# Patient Record
Sex: Female | Born: 1970 | Race: White | Hispanic: No | Marital: Married | Smoking: Current every day smoker
Health system: Southern US, Community
[De-identification: ages and names within clinical notes are randomized; demographics above are authoritative.]

## PROBLEM LIST (undated history)

## (undated) DIAGNOSIS — C801 Malignant (primary) neoplasm, unspecified: Secondary | ICD-10-CM

## (undated) DIAGNOSIS — F909 Attention-deficit hyperactivity disorder, unspecified type: Secondary | ICD-10-CM

## (undated) DIAGNOSIS — G8929 Other chronic pain: Secondary | ICD-10-CM

## (undated) DIAGNOSIS — I10 Essential (primary) hypertension: Secondary | ICD-10-CM

## (undated) DIAGNOSIS — J45909 Unspecified asthma, uncomplicated: Secondary | ICD-10-CM

## (undated) DIAGNOSIS — M542 Cervicalgia: Secondary | ICD-10-CM

## (undated) DIAGNOSIS — M549 Dorsalgia, unspecified: Secondary | ICD-10-CM

## (undated) DIAGNOSIS — F419 Anxiety disorder, unspecified: Secondary | ICD-10-CM

## (undated) HISTORY — PX: TONSILLECTOMY: SUR1361

## (undated) HISTORY — PX: ABDOMINAL SURGERY: SHX537

## (undated) HISTORY — PX: ABDOMINAL HYSTERECTOMY: SHX81

---

## 2002-04-26 ENCOUNTER — Encounter: Payer: Self-pay | Admitting: Emergency Medicine

## 2002-04-26 ENCOUNTER — Emergency Department (HOSPITAL_COMMUNITY): Admission: EM | Admit: 2002-04-26 | Discharge: 2002-04-26 | Payer: Self-pay | Admitting: Emergency Medicine

## 2015-10-06 ENCOUNTER — Encounter (HOSPITAL_COMMUNITY): Payer: Self-pay | Admitting: Emergency Medicine

## 2015-10-06 ENCOUNTER — Emergency Department (HOSPITAL_COMMUNITY): Payer: Self-pay

## 2015-10-06 ENCOUNTER — Emergency Department (HOSPITAL_COMMUNITY)
Admission: EM | Admit: 2015-10-06 | Discharge: 2015-10-06 | Disposition: A | Discharge status: Home / Self Care | Payer: Self-pay | Attending: Emergency Medicine | Admitting: Emergency Medicine

## 2015-10-06 DIAGNOSIS — F17210 Nicotine dependence, cigarettes, uncomplicated: Secondary | ICD-10-CM | POA: Insufficient documentation

## 2015-10-06 DIAGNOSIS — J45901 Unspecified asthma with (acute) exacerbation: Secondary | ICD-10-CM | POA: Insufficient documentation

## 2015-10-06 DIAGNOSIS — I10 Essential (primary) hypertension: Secondary | ICD-10-CM | POA: Insufficient documentation

## 2015-10-06 DIAGNOSIS — R000 Tachycardia, unspecified: Secondary | ICD-10-CM | POA: Insufficient documentation

## 2015-10-06 HISTORY — DX: Essential (primary) hypertension: I10

## 2015-10-06 HISTORY — DX: Unspecified asthma, uncomplicated: J45.909

## 2015-10-06 LAB — BASIC METABOLIC PANEL
Anion gap: 9 (ref 5–15)
BUN: 9 mg/dL (ref 6–20)
CALCIUM: 9.7 mg/dL (ref 8.9–10.3)
CO2: 25 mmol/L (ref 22–32)
Chloride: 106 mmol/L (ref 101–111)
Creatinine, Ser: 0.83 mg/dL (ref 0.44–1.00)
GFR calc Af Amer: >60 mL/min (ref >60)
GLUCOSE: 103 mg/dL — AB (ref 65–99)
POTASSIUM: 3.9 mmol/L (ref 3.5–5.1)
Sodium: 140 mmol/L (ref 135–145)

## 2015-10-06 LAB — CBC WITH DIFFERENTIAL/PLATELET
Basophils Absolute: 0 10*3/uL (ref 0.0–0.1)
Basophils Relative: 0 %
EOS PCT: 8 %
Eosinophils Absolute: 0.7 10*3/uL (ref 0.0–0.7)
HEMATOCRIT: 39 % (ref 36.0–46.0)
Hemoglobin: 13.3 g/dL (ref 12.0–15.0)
LYMPHS ABS: 2.7 10*3/uL (ref 0.7–4.0)
LYMPHS PCT: 30 %
MCH: 28.3 pg (ref 26.0–34.0)
MCHC: 34.1 g/dL (ref 30.0–36.0)
MCV: 83 fL (ref 78.0–100.0)
MONO ABS: 0.4 10*3/uL (ref 0.1–1.0)
MONOS PCT: 5 %
Neutro Abs: 5.1 10*3/uL (ref 1.7–7.7)
Neutrophils Relative %: 57 %
PLATELETS: 361 10*3/uL (ref 150–400)
RBC: 4.7 MIL/uL (ref 3.87–5.11)
RDW: 13.9 % (ref 11.5–15.5)
WBC: 8.9 10*3/uL (ref 4.0–10.5)

## 2015-10-06 MED ORDER — SODIUM CHLORIDE 0.9 % IV BOLUS (SEPSIS)
1000.0000 mL | Freq: Once | INTRAVENOUS | Status: AC
  Administered 2015-10-06: 1000 mL via INTRAVENOUS

## 2015-10-06 MED ORDER — MAGNESIUM SULFATE 2 GM/50ML IV SOLN
2.0000 g | Freq: Once | INTRAVENOUS | Status: AC
  Administered 2015-10-06: 2 g via INTRAVENOUS
  Filled 2015-10-06: qty 50

## 2015-10-06 MED ORDER — ALBUTEROL SULFATE HFA 108 (90 BASE) MCG/ACT IN AERS
2.0000 | INHALATION_SPRAY | Freq: Once | RESPIRATORY_TRACT | Status: AC
  Administered 2015-10-06: 2 via RESPIRATORY_TRACT
  Filled 2015-10-06: qty 6.7

## 2015-10-06 MED ORDER — ALBUTEROL SULFATE (2.5 MG/3ML) 0.083% IN NEBU
5.0000 mg | INHALATION_SOLUTION | Freq: Once | RESPIRATORY_TRACT | Status: AC
  Administered 2015-10-06: 5 mg via RESPIRATORY_TRACT

## 2015-10-06 MED ORDER — PREDNISONE 20 MG PO TABS: 60.0000 mg | ORAL_TABLET | Freq: Every day | ORAL | Status: AC

## 2015-10-06 MED ORDER — METHYLPREDNISOLONE SODIUM SUCC 125 MG IJ SOLR
125.0000 mg | Freq: Once | INTRAMUSCULAR | Status: AC
  Administered 2015-10-06: 125 mg via INTRAVENOUS
  Filled 2015-10-06: qty 2

## 2015-10-06 MED ORDER — ALBUTEROL SULFATE (2.5 MG/3ML) 0.083% IN NEBU
INHALATION_SOLUTION | RESPIRATORY_TRACT | Status: AC
  Filled 2015-10-06: qty 6

## 2015-10-06 MED ORDER — IPRATROPIUM BROMIDE 0.02 % IN SOLN
1.0000 mg | Freq: Once | RESPIRATORY_TRACT | Status: AC
  Administered 2015-10-06: 1 mg via RESPIRATORY_TRACT
  Filled 2015-10-06: qty 5

## 2015-10-06 MED ORDER — ACETAMINOPHEN-CODEINE #3 300-30 MG PO TABS
1.0000 | ORAL_TABLET | Freq: Once | ORAL | Status: AC
  Administered 2015-10-06: 1 via ORAL
  Filled 2015-10-06: qty 1

## 2015-10-06 MED ORDER — BENZONATATE 100 MG PO CAPS: 100.0000 mg | ORAL_CAPSULE | Freq: Two times a day (BID) | ORAL | Status: AC | PRN

## 2015-10-06 NOTE — ED Provider Notes (Signed)
CSN: [id number]     Arrival date & time 10/06/15  0211 History  By signing my name below, I, Sonum Patel, attest that this documentation has been prepared under the direction and in the presence of [name], MD. Electronically Signed: Sonum Patel, Education administrator. 10/06/2015. 3:12 AM.    Chief Complaint  Patient presents with  . Shortness of Breath  . Cough  . Wheezing   The history is provided by the patient. No language interpreter was used.     HPI Comments: Felicia Rhodes is a 44 y.o. female with past medical history of asthma who presents to the Emergency Department complaining of an intermittent cough with associated SOB and wheezing for the past week which worsened yesterday. She also has associated subjective fever and chills, sore throat, HA, and upper chest pain with deep breathing. She states the symptoms initially improved but then worsened yesterday. She reports lower leg swelling with the right leg worse than the left. She does not have an inhaler at home. She reports starting a new job where a co-worker has a cough. She is current daily smoker.    Past Medical History  Diagnosis Date  . Asthma   . Hypertension    History reviewed. No pertinent past surgical history. No family history on file. Social History  Substance Use Topics  . Smoking status: Current Every Day Smoker  . Smokeless tobacco: None  . Alcohol Use: No   OB History    No data available     Review of Systems  A complete 10 system review of systems was obtained and all systems are negative except as noted in the HPI and PMH.    Allergies  Review of patient's allergies indicates no known allergies.  Home Medications   Prior to Admission medications   Not on File   BP 161/107 mmHg  Pulse 131  Temp(Src) 97.9 F (36.6 C) (Oral)  Resp 34  Ht 4\' 11"  (1.499 m)  Wt 152 lb (68.947 kg)  BMI 30.68 kg/m2  SpO2 95%  LMP 09/17/2015 Physical Exam  Constitutional: She is oriented to person, place, and  time. She appears well-developed and well-nourished. She appears distressed.  HENT:  Head: Normocephalic and atraumatic.  Nose: Nose normal.  Mouth/Throat: Oropharynx is clear and moist. No oropharyngeal exudate.  Eyes: Conjunctivae and EOM are normal. Pupils are equal, round, and reactive to light. No scleral icterus.  Neck: Normal range of motion. Neck supple. No JVD present. No tracheal deviation present. No thyromegaly present.  Cardiovascular: Regular rhythm and normal heart sounds.  Tachycardia present.  Exam reveals no gallop and no friction rub.   No murmur heard. Pulmonary/Chest: Effort normal. Tachypnea noted. No respiratory distress. She has wheezes. She exhibits no tenderness.  Mild inspiratory wheezing. Moderate expiratory wheezing   Abdominal: Soft. Bowel sounds are normal. She exhibits no distension and no mass. There is no tenderness. There is no rebound and no guarding.  Musculoskeletal: Normal range of motion. She exhibits no edema or tenderness.  Lymphadenopathy:    She has no cervical adenopathy.  Neurological: She is alert and oriented to person, place, and time. No cranial nerve deficit. She exhibits normal muscle tone.  Skin: Skin is warm and dry. No rash noted. No erythema. No pallor.  Nursing note and vitals reviewed.   ED Course  Procedures (including critical care time)  DIAGNOSTIC STUDIES: Oxygen Saturation is 95% on RA, normal by my interpretation.    COORDINATION OF CARE: 3:17 AM Discussed  treatment plan with pt at bedside and pt agreed to plan.   Labs Review Labs Reviewed  BASIC METABOLIC PANEL - Abnormal; Notable for the following:    Glucose, Bld 103 (*)    All other components within normal limits  CBC WITH DIFFERENTIAL/PLATELET    Imaging Review Dg Chest 2 View  10/06/2015  CLINICAL DATA:  Productive cough for 1 day.  Asthma. EXAM: CHEST  2 VIEW COMPARISON:  None. FINDINGS: The cardiomediastinal contours are normal. Minimal subsegmental  atelectasis at the left lung base. Pulmonary vasculature is normal. No consolidation, pleural effusion, or pneumothorax. No acute osseous abnormalities are seen. IMPRESSION: Minimal subsegmental atelectasis at the left lung base. Otherwise clear lungs. Electronically Signed   By: Jeb Levering M.D.   On: 10/06/2015 04:03   I have personally reviewed and evaluated these images and lab results as part of my medical decision-making.   EKG Interpretation   Date/Time:  Saturday October 06 2015 02:28:15 EST Ventricular Rate:  106 PR Interval:  138 QRS Duration: 82 QT Interval:  332 QTC Calculation: 441 R Axis:   40 Text Interpretation:  Sinus tachycardia Otherwise normal ECG No old  tracing to compare Confirmed by Glynn Octave [phone]) on  10/06/2015 4:35:32 AM      MDM   Final diagnoses:  None    Patient presents to emergency department for asthma exacerbation. On my examination she has only mild wheezing. Per the RN staff, patient had significant wheezing. She had already received albuterol treatment prior to my assessment. I then ordered ipratropium, Solu-Medrol, magnesium for further care. Patient was tachycardic to the 130s, likely due to albuterol treatment. She states she is one out of her inhaler at home. She has no history of blood clots in the past, nor does she have any risk factors. I be on inhaler was refilled, patient will be sent home with prednisone course. Patient also given Tessalon Perles for cough. She appears well in no acute distress, she states she is now at her baseline. Vital signs were within her normal limits aside from tachycardia induced by albuterol, patient is safe for discharge.   I personally performed the services described in this documentation, which was scribed in my presence. The recorded information has been reviewed and is accurate.     [name], MD 10/06/15 [phone]

## 2015-10-06 NOTE — ED Notes (Signed)
Patient transported to X-ray 

## 2015-10-06 NOTE — ED Notes (Signed)
Pt remains monitored by blood pressure, pulse ox, and 12 lead.  

## 2015-10-06 NOTE — ED Notes (Signed)
Pt. reports SOB with productive cough , chest congestion and wheezing onset this week , denies fever or chills.

## 2015-10-06 NOTE — Discharge Instructions (Signed)
Asthma Attack Prevention Felicia Rhodes, use the albuterol inhaler every 6 hours for 2 days. Then use it only as needed. Take prednisone for 4 more days. See a primary care physician within 3 days for close follow-up. If any symptoms worsen come back to emergency department immediately. Thank you. While you may not be able to control the fact that you have asthma, you can take actions to prevent asthma attacks. The best way to prevent asthma attacks is to maintain good control of your asthma. You can achieve this by:  Taking your medicines as directed.  Avoiding things that can irritate your airways or make your asthma symptoms worse (asthma triggers).  Keeping track of how well your asthma is controlled and of any changes in your symptoms.  Responding quickly to worsening asthma symptoms (asthma attack).  Seeking emergency care when it is needed. WHAT ARE SOME WAYS TO PREVENT AN ASTHMA ATTACK? Have a Plan Work with your health care provider to create a written plan for managing and treating your asthma attacks (asthma action plan). This plan includes:  A list of your asthma triggers and how you can avoid them.  Information on when medicines should be taken and when their dosages should be changed.  The use of a device that measures how well your lungs are working (peak flow meter). Monitor Your Asthma Use your peak flow meter and record your results in a journal every day. A drop in your peak flow numbers on one or more days may indicate the start of an asthma attack. This can happen even before you start to feel symptoms. You can prevent an asthma attack from getting worse by following the steps in your asthma action plan. Avoid Asthma Triggers Work with your asthma health care provider to find out what your asthma triggers are. This can be done by:  Allergy testing.  Keeping a journal that notes when asthma attacks occur and the factors that may have contributed to them.  Determining if  there are other medical conditions that are making your asthma worse. Once you have determined your asthma triggers, take steps to avoid them. This may include avoiding excessive or prolonged exposure to:  Dust. Have someone dust and vacuum your home for you once or twice a week. Using a high-efficiency particulate arrestance (HEPA) vacuum is best.  Smoke. This includes campfire smoke, forest fire smoke, and secondhand smoke from tobacco products.  Pet dander. Avoid contact with animals that you know you are allergic to.  Allergens from trees, grasses or pollens. Avoid spending a lot of time outdoors when pollen counts are high, and on very windy days.  Very cold, dry, or humid air.  Mold.  Foods that contain high amounts of sulfites.  Strong odors.  Outdoor air pollutants, such as Lexicographer.  Indoor air pollutants, such as aerosol sprays and fumes from household cleaners.  Household pests, including dust mites and cockroaches, and pest droppings.  Certain medicines, including NSAIDs. Always talk to your health care provider before stopping or starting any new medicines. Medicines Take over-the-counter and prescription medicines only as told by your health care provider. Many asthma attacks can be prevented by carefully following your medicine schedule. Taking your medicines correctly is especially important when you cannot avoid certain asthma triggers. Act Quickly If an asthma attack does happen, acting quickly can decrease how severe it is and how long it lasts. Take these steps:   Pay attention to your symptoms. If you are coughing, wheezing,  or having difficulty breathing, do not wait to see if your symptoms go away on their own. Follow your asthma action plan.  If you have followed your asthma action plan and your symptoms are not improving, call your health care provider or seek immediate medical care at the nearest hospital. It is important to note how often you need to  use your fast-acting rescue inhaler. If you are using your rescue inhaler more often, it may mean that your asthma is not under control. Adjusting your asthma treatment plan may help you to prevent future asthma attacks and help you to gain better control of your condition. HOW CAN I PREVENT AN ASTHMA ATTACK WHEN I EXERCISE? Follow advice from your health care provider about whether you should use your fast-acting inhaler before exercising. Many people with asthma experience exercise-induced bronchoconstriction (EIB). This condition often worsens during vigorous exercise in cold, humid, or dry environments. Usually, people with EIB can stay very active by pre-treating with a fast-acting inhaler before exercising.   This information is not intended to replace advice given to you by your health care provider. Make sure you discuss any questions you have with your health care provider.   Document Released: 10/22/2009 Document Revised: 07/25/2015 Document Reviewed: 04/05/2015 Elsevier Interactive Patient Education [employer].

## 2016-04-13 ENCOUNTER — Encounter (HOSPITAL_BASED_OUTPATIENT_CLINIC_OR_DEPARTMENT_OTHER): Payer: Self-pay | Admitting: Emergency Medicine

## 2016-04-13 ENCOUNTER — Emergency Department (HOSPITAL_BASED_OUTPATIENT_CLINIC_OR_DEPARTMENT_OTHER)
Admission: EM | Admit: 2016-04-13 | Discharge: 2016-04-13 | Disposition: A | Discharge status: Home / Self Care | Payer: No Typology Code available for payment source | Attending: Emergency Medicine | Admitting: Emergency Medicine

## 2016-04-13 ENCOUNTER — Emergency Department (HOSPITAL_BASED_OUTPATIENT_CLINIC_OR_DEPARTMENT_OTHER): Payer: No Typology Code available for payment source

## 2016-04-13 DIAGNOSIS — I10 Essential (primary) hypertension: Secondary | ICD-10-CM | POA: Diagnosis not present

## 2016-04-13 DIAGNOSIS — Y92410 Unspecified street and highway as the place of occurrence of the external cause: Secondary | ICD-10-CM | POA: Diagnosis not present

## 2016-04-13 DIAGNOSIS — Y9389 Activity, other specified: Secondary | ICD-10-CM | POA: Diagnosis not present

## 2016-04-13 DIAGNOSIS — J45909 Unspecified asthma, uncomplicated: Secondary | ICD-10-CM | POA: Diagnosis not present

## 2016-04-13 DIAGNOSIS — S161XXA Strain of muscle, fascia and tendon at neck level, initial encounter: Secondary | ICD-10-CM | POA: Insufficient documentation

## 2016-04-13 DIAGNOSIS — F17200 Nicotine dependence, unspecified, uncomplicated: Secondary | ICD-10-CM | POA: Insufficient documentation

## 2016-04-13 DIAGNOSIS — Z79899 Other long term (current) drug therapy: Secondary | ICD-10-CM | POA: Diagnosis not present

## 2016-04-13 DIAGNOSIS — S199XXA Unspecified injury of neck, initial encounter: Secondary | ICD-10-CM | POA: Diagnosis present

## 2016-04-13 DIAGNOSIS — S20219A Contusion of unspecified front wall of thorax, initial encounter: Secondary | ICD-10-CM | POA: Diagnosis not present

## 2016-04-13 DIAGNOSIS — S301XXA Contusion of abdominal wall, initial encounter: Secondary | ICD-10-CM | POA: Insufficient documentation

## 2016-04-13 DIAGNOSIS — S40012A Contusion of left shoulder, initial encounter: Secondary | ICD-10-CM | POA: Insufficient documentation

## 2016-04-13 DIAGNOSIS — R1010 Upper abdominal pain, unspecified: Secondary | ICD-10-CM | POA: Diagnosis not present

## 2016-04-13 DIAGNOSIS — Y999 Unspecified external cause status: Secondary | ICD-10-CM | POA: Insufficient documentation

## 2016-04-13 LAB — COMPREHENSIVE METABOLIC PANEL
ALT: 12 U/L — ABNORMAL LOW (ref 14–54)
AST: 13 U/L — ABNORMAL LOW (ref 15–41)
Albumin: 4 g/dL (ref 3.5–5.0)
Alkaline Phosphatase: 53 U/L (ref 38–126)
Anion gap: 5 (ref 5–15)
BUN: 12 mg/dL (ref 6–20)
CHLORIDE: 106 mmol/L (ref 101–111)
CO2: 28 mmol/L (ref 22–32)
CREATININE: 0.61 mg/dL (ref 0.44–1.00)
Calcium: 9.4 mg/dL (ref 8.9–10.3)
Glucose, Bld: 97 mg/dL (ref 65–99)
POTASSIUM: 3.5 mmol/L (ref 3.5–5.1)
Sodium: 139 mmol/L (ref 135–145)
Total Bilirubin: 0.3 mg/dL (ref 0.3–1.2)
Total Protein: 7 g/dL (ref 6.5–8.1)

## 2016-04-13 LAB — CBC WITH DIFFERENTIAL/PLATELET
BASOS ABS: 0.1 10*3/uL (ref 0.0–0.1)
Basophils Relative: 1 %
EOS ABS: 0.4 10*3/uL (ref 0.0–0.7)
Eosinophils Relative: 5 %
HCT: 36.3 % (ref 36.0–46.0)
HEMOGLOBIN: 12.2 g/dL (ref 12.0–15.0)
LYMPHS ABS: 2.6 10*3/uL (ref 0.7–4.0)
Lymphocytes Relative: 28 %
MCH: 27.9 pg (ref 26.0–34.0)
MCHC: 33.6 g/dL (ref 30.0–36.0)
MCV: 82.9 fL (ref 78.0–100.0)
Monocytes Absolute: 0.6 10*3/uL (ref 0.1–1.0)
Monocytes Relative: 6 %
NEUTROS PCT: 60 %
Neutro Abs: 5.6 10*3/uL (ref 1.7–7.7)
PLATELETS: 352 10*3/uL (ref 150–400)
RBC: 4.38 MIL/uL (ref 3.87–5.11)
RDW: 13.7 % (ref 11.5–15.5)
WBC: 9.3 10*3/uL (ref 4.0–10.5)

## 2016-04-13 LAB — URINALYSIS, ROUTINE W REFLEX MICROSCOPIC
BILIRUBIN URINE: NEGATIVE
GLUCOSE, UA: NEGATIVE mg/dL
HGB URINE DIPSTICK: NEGATIVE
KETONES UR: NEGATIVE mg/dL
Leukocytes, UA: NEGATIVE
Nitrite: NEGATIVE
PROTEIN: NEGATIVE mg/dL
Specific Gravity, Urine: 1.014 (ref 1.005–1.030)
pH: 6 (ref 5.0–8.0)

## 2016-04-13 MED ORDER — IOPAMIDOL (ISOVUE-300) INJECTION 61%
100.0000 mL | Freq: Once | INTRAVENOUS | Status: AC | PRN
  Administered 2016-04-13: 100 mL via INTRAVENOUS

## 2016-04-13 MED ORDER — MORPHINE SULFATE (PF) 4 MG/ML IV SOLN
4.0000 mg | Freq: Once | INTRAVENOUS | Status: AC
  Administered 2016-04-13: 4 mg via INTRAVENOUS
  Filled 2016-04-13: qty 1

## 2016-04-13 MED ORDER — HYDROCODONE-ACETAMINOPHEN 5-325 MG PO TABS: 1.0000 | ORAL_TABLET | Freq: Four times a day (QID) | ORAL | Status: AC | PRN

## 2016-04-13 MED ORDER — MORPHINE SULFATE (PF) 4 MG/ML IV SOLN
4.0000 mg | Freq: Once | INTRAVENOUS | Status: AC
Start: 2016-04-13 — End: 2016-04-13
  Administered 2016-04-13: 4 mg via INTRAVENOUS
  Filled 2016-04-13: qty 1
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
## 2016-04-13 NOTE — ED Notes (Signed)
Patient transported to CT 

## 2016-09-08 ENCOUNTER — Emergency Department (HOSPITAL_BASED_OUTPATIENT_CLINIC_OR_DEPARTMENT_OTHER)
Admission: EM | Admit: 2016-09-08 | Discharge: 2016-09-08 | Disposition: A | Discharge status: Home / Self Care | Payer: Managed Care, Other (non HMO) | Attending: Physician Assistant | Admitting: Physician Assistant

## 2016-09-08 ENCOUNTER — Encounter (HOSPITAL_BASED_OUTPATIENT_CLINIC_OR_DEPARTMENT_OTHER): Payer: Self-pay | Admitting: *Deleted

## 2016-09-08 ENCOUNTER — Emergency Department (HOSPITAL_BASED_OUTPATIENT_CLINIC_OR_DEPARTMENT_OTHER): Payer: Managed Care, Other (non HMO)

## 2016-09-08 DIAGNOSIS — F17200 Nicotine dependence, unspecified, uncomplicated: Secondary | ICD-10-CM | POA: Insufficient documentation

## 2016-09-08 DIAGNOSIS — Z859 Personal history of malignant neoplasm, unspecified: Secondary | ICD-10-CM | POA: Insufficient documentation

## 2016-09-08 DIAGNOSIS — I10 Essential (primary) hypertension: Secondary | ICD-10-CM | POA: Insufficient documentation

## 2016-09-08 DIAGNOSIS — R55 Syncope and collapse: Secondary | ICD-10-CM | POA: Insufficient documentation

## 2016-09-08 DIAGNOSIS — F909 Attention-deficit hyperactivity disorder, unspecified type: Secondary | ICD-10-CM | POA: Insufficient documentation

## 2016-09-08 DIAGNOSIS — J45909 Unspecified asthma, uncomplicated: Secondary | ICD-10-CM | POA: Diagnosis not present

## 2016-09-08 DIAGNOSIS — R42 Dizziness and giddiness: Secondary | ICD-10-CM | POA: Diagnosis present

## 2016-09-08 DIAGNOSIS — Z79899 Other long term (current) drug therapy: Secondary | ICD-10-CM | POA: Diagnosis not present

## 2016-09-08 HISTORY — DX: Cervicalgia: M54.2

## 2016-09-08 HISTORY — DX: Malignant (primary) neoplasm, unspecified: C80.1

## 2016-09-08 HISTORY — DX: Anxiety disorder, unspecified: F41.9

## 2016-09-08 HISTORY — DX: Dorsalgia, unspecified: M54.9

## 2016-09-08 HISTORY — DX: Other chronic pain: G89.29

## 2016-09-08 HISTORY — DX: Attention-deficit hyperactivity disorder, unspecified type: F90.9

## 2016-09-08 LAB — COMPREHENSIVE METABOLIC PANEL
ALT: 15 U/L (ref 14–54)
ANION GAP: 6 (ref 5–15)
AST: 15 U/L (ref 15–41)
Albumin: 4.2 g/dL (ref 3.5–5.0)
Alkaline Phosphatase: 53 U/L (ref 38–126)
BUN: 14 mg/dL (ref 6–20)
CALCIUM: 9.7 mg/dL (ref 8.9–10.3)
CHLORIDE: 103 mmol/L (ref 101–111)
CO2: 28 mmol/L (ref 22–32)
CREATININE: 0.75 mg/dL (ref 0.44–1.00)
Glucose, Bld: 100 mg/dL — ABNORMAL HIGH (ref 65–99)
Potassium: 3.8 mmol/L (ref 3.5–5.1)
SODIUM: 137 mmol/L (ref 135–145)
Total Bilirubin: 0.3 mg/dL (ref 0.3–1.2)
Total Protein: 7.5 g/dL (ref 6.5–8.1)

## 2016-09-08 LAB — CBC WITH DIFFERENTIAL/PLATELET
BASOS PCT: 0 %
Basophils Absolute: 0 10*3/uL (ref 0.0–0.1)
EOS ABS: 0.2 10*3/uL (ref 0.0–0.7)
Eosinophils Relative: 2 %
HCT: 39.6 % (ref 36.0–46.0)
HEMOGLOBIN: 13.6 g/dL (ref 12.0–15.0)
LYMPHS ABS: 1.9 10*3/uL (ref 0.7–4.0)
Lymphocytes Relative: 15 %
MCH: 27.9 pg (ref 26.0–34.0)
MCHC: 34.3 g/dL (ref 30.0–36.0)
MCV: 81.1 fL (ref 78.0–100.0)
Monocytes Absolute: 0.8 10*3/uL (ref 0.1–1.0)
Monocytes Relative: 7 %
NEUTROS ABS: 9.2 10*3/uL — AB (ref 1.7–7.7)
NEUTROS PCT: 76 %
Platelets: 351 10*3/uL (ref 150–400)
RBC: 4.88 MIL/uL (ref 3.87–5.11)
RDW: 13.7 % (ref 11.5–15.5)
WBC: 12.1 10*3/uL — AB (ref 4.0–10.5)

## 2016-09-08 LAB — TROPONIN I

## 2016-09-08 MED ORDER — SODIUM CHLORIDE 0.9 % IV BOLUS (SEPSIS)
1000.0000 mL | Freq: Once | INTRAVENOUS | Status: AC
  Administered 2016-09-08: 1000 mL via INTRAVENOUS

## 2016-09-08 MED ORDER — ONDANSETRON HCL 4 MG/2ML IJ SOLN
4.0000 mg | Freq: Once | INTRAMUSCULAR | Status: AC
  Administered 2016-09-08: 4 mg via INTRAVENOUS
  Filled 2016-09-08: qty 2

## 2016-09-08 NOTE — ED Provider Notes (Signed)
[facility] Provider Note   CSN: [id number] Arrival date & time: 09/08/16  1131     History   Chief Complaint Chief Complaint  Patient presents with  . Dizziness    HPI Felicia Rhodes is a 45 y.o. female.  HPI   She is a 82 her old female with history of hypertension, chronic pain, anxiety and panic attacks presenting today with episode of multiple symptoms. Patient reports that she was standing she all of a sudden felt hot, had a headache, mouth dry, tingling in her feet and in her hands, and then she felt like she couldn't feel any of her extremities and "almost passed out".  Patient said she's had anxiety attacks in the past and this one felt different so she called EMS.  Past Medical History:  Diagnosis Date  . ADHD   . Anxiety   . Asthma   . Cancer ([place])   . Chronic back pain   . Chronic neck pain   . Hypertension     There are no active problems to display for this patient.   Past Surgical History:  Procedure Laterality Date  . ABDOMINAL HYSTERECTOMY    . ABDOMINAL SURGERY    . TONSILLECTOMY      OB History    No data available       Home Medications    Prior to Admission medications   Medication Sig Start Date End Date Taking? Authorizing Provider  AMLODIPINE BESYLATE PO Take by mouth.   Yes Historical Provider, MD  Amphetamine-Dextroamphetamine (ADDERALL PO) Take by mouth.   Yes Historical Provider, MD  lisinopril (PRINIVIL,ZESTRIL) 20 MG tablet Take 20 mg by mouth daily.   Yes Historical Provider, MD  TRAMADOL HCL PO Take by mouth.   Yes Historical Provider, MD  Vitamin D, Ergocalciferol, (DRISDOL) 50000 units CAPS capsule Take 50,000 Units by mouth every 7 (seven) days.   Yes Historical Provider, MD  benzonatate (TESSALON PERLES) 100 MG capsule Take 1 capsule (100 mg total) by mouth 2 (two) times daily as needed for cough. 10/06/15   [name], MD  HYDROcodone-acetaminophen (NORCO) 5-325 MG tablet Take 1-2 tablets by mouth every 6  (six) hours as needed for severe pain. 04/13/16   Orlie Dakin, MD  predniSONE (DELTASONE) 20 MG tablet Take 3 tablets (60 mg total) by mouth daily. 10/06/15   [name], MD    Family History No family history on file.  Social History Social History  Substance Use Topics  . Smoking status: Current Every Day Smoker  . Smokeless tobacco: Never Used  . Alcohol use No     Allergies   Review of patient's allergies indicates no known allergies.   Review of Systems Review of Systems  Constitutional: Negative for fatigue and fever.  HENT: Positive for trouble swallowing.   Cardiovascular: Positive for chest pain.  Neurological: Positive for dizziness, tremors, weakness and light-headedness.  Psychiatric/Behavioral: The patient is nervous/anxious.   All other systems reviewed and are negative.    Physical Exam Updated Vital Signs BP 152/72 (BP Location: Right Arm)   Pulse 74   Temp 97.9 F (36.6 C) (Oral)   Resp 18   Ht 4\' 11"  (1.499 m)   Wt 166 lb (75.3 kg)   LMP 09/17/2015 (Exact Date)   SpO2 100%   BMI 33.53 kg/m   Physical Exam  Constitutional: She is oriented to person, place, and time. She appears well-developed and well-nourished.  HENT:  Head: Normocephalic and atraumatic.  Eyes:  Right eye exhibits no discharge.  Cardiovascular: Normal rate.   Murmur heard. 3/5 systolic murmor  Pulmonary/Chest: Effort normal.  Neurological: She is oriented to person, place, and time.  Equal strength bilaterally upper and lower extremities negative pronator drift. Normal sensation bilaterally. Speech comprehensible, no slurring. Facial nerve tested and appears grossly normal. Alert and oriented 3.  Skin: Skin is warm and dry. She is not diaphoretic.  Psychiatric: She has a normal mood and affect.  Nursing note and vitals reviewed.    ED Treatments / Results  Labs (all labs ordered are listed, but only abnormal results are displayed) Labs Reviewed  COMPREHENSIVE  METABOLIC PANEL - Abnormal; Notable for the following:       Result Value   Glucose, Bld 100 (*)    All other components within normal limits  CBC WITH DIFFERENTIAL/PLATELET - Abnormal; Notable for the following:    WBC 12.1 (*)    Neutro Abs 9.2 (*)    All other components within normal limits  TROPONIN I    EKG  EKG Interpretation None       Radiology Dg Chest 2 View  Result Date: 09/08/2016 CLINICAL DATA:  Syncopal episode and chest pain EXAM: CHEST  2 VIEW COMPARISON:  04/13/2016 FINDINGS: The heart size and mediastinal contours are within normal limits. Both lungs are clear. The visualized skeletal structures are unremarkable. IMPRESSION: No active cardiopulmonary disease. Electronically Signed   By: Inez Catalina M.D.   On: 09/08/2016 12:38    Procedures Procedures (including critical care time)  Medications Ordered in ED Medications  sodium chloride 0.9 % bolus 1,000 mL (0 mLs Intravenous Stopped 09/08/16 1412)  ondansetron (ZOFRAN) injection 4 mg (4 mg Intravenous Given 09/08/16 1259)     Initial Impression / Assessment and Plan / ED Course  I have reviewed the triage vital signs and the nursing notes.  Pertinent labs & imaging results that were available during my care of the patient were reviewed by me and considered in my medical decision making (see chart for details).  Clinical Course    Patient is a 89 her old female presenting with multiple symptoms. Since there was episode that occurred today which she had multiple symptoms, I think this is consistent with a panic attack. Or otherwise stress reaction. It is since resolved. Patient still states she feels weak but she is able to lift all extremities against gravity she says is improving. I have any other neurologic syndrome or cardiac syndrome that would fit the symptoms.  Patient has low heart score so one troponin should be sufficient to rule out cardiac etiology with a normal EKG.   2:34 PM Patient  ambulating wtihout issue. Labs reassuring.  Will have her follow up with primary care provider.    Patient is comfortable, ambulatory, and taking PO at time of discharge.  Patient expressed understanding about return precautions.     Final Clinical Impressions(s) / ED Diagnoses   Final diagnoses:  None    New Prescriptions New Prescriptions   No medications on file     Courteney Julio Alm, MD 09/09/16 [id number]

## 2016-09-08 NOTE — Discharge Instructions (Signed)
We are unsure what caused your symptoms today may have been from anxiety or other cause. We want you to follow up with your primary care physician.  Your labs and imaging are reassuring.

## 2016-09-08 NOTE — ED Triage Notes (Addendum)
Sudden onset of dizziness 30 minutes ago while at work. She felt like she was going to pass out. She has been treated with antibiotics for a sinus infection over the past 2 weeks. To ED via EMS. Ems said her breathing was rapid and unlabored on their arrival. Her hands and feet have been numb. States she is having pain in her back and neck. Hx of chronic pain in her back and neck. Extreme thirst. CBG 92 per EMS.

## 2016-09-08 NOTE — ED Notes (Signed)
Pt given d/c instructions as per chart. Verbalizes understanding. No questions. 

## 2016-09-08 NOTE — ED Notes (Signed)
Ambulated Patient to the bathroom

## 2017-12-21 IMAGING — CR DG CHEST 2V
2 series · 2 of 2 positions shown · non-contrast
Comparison: 04/13/2016

CLINICAL DATA: Syncopal episode and chest pain

EXAM:
CHEST  2 VIEW

[w chest pa]
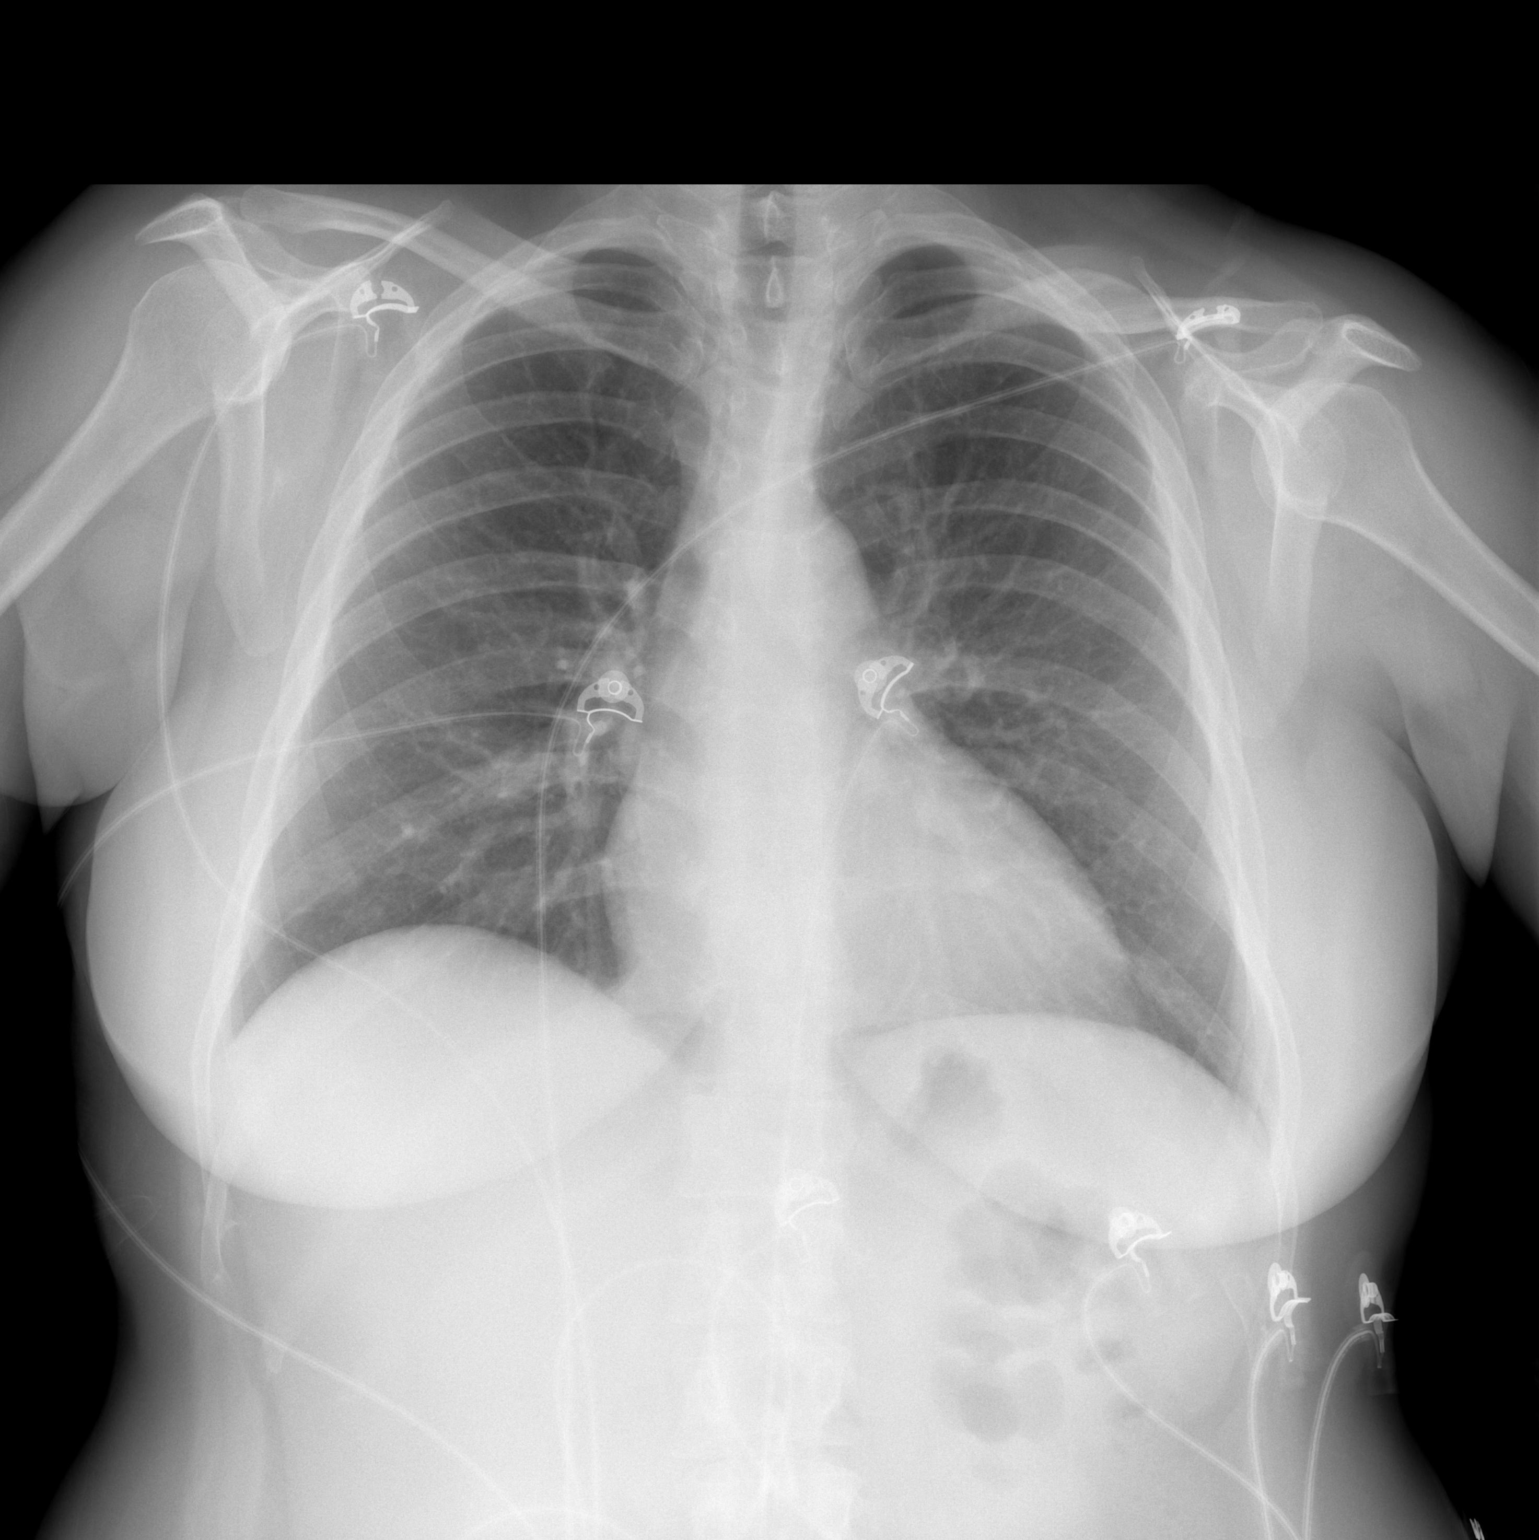

[w chest lat]
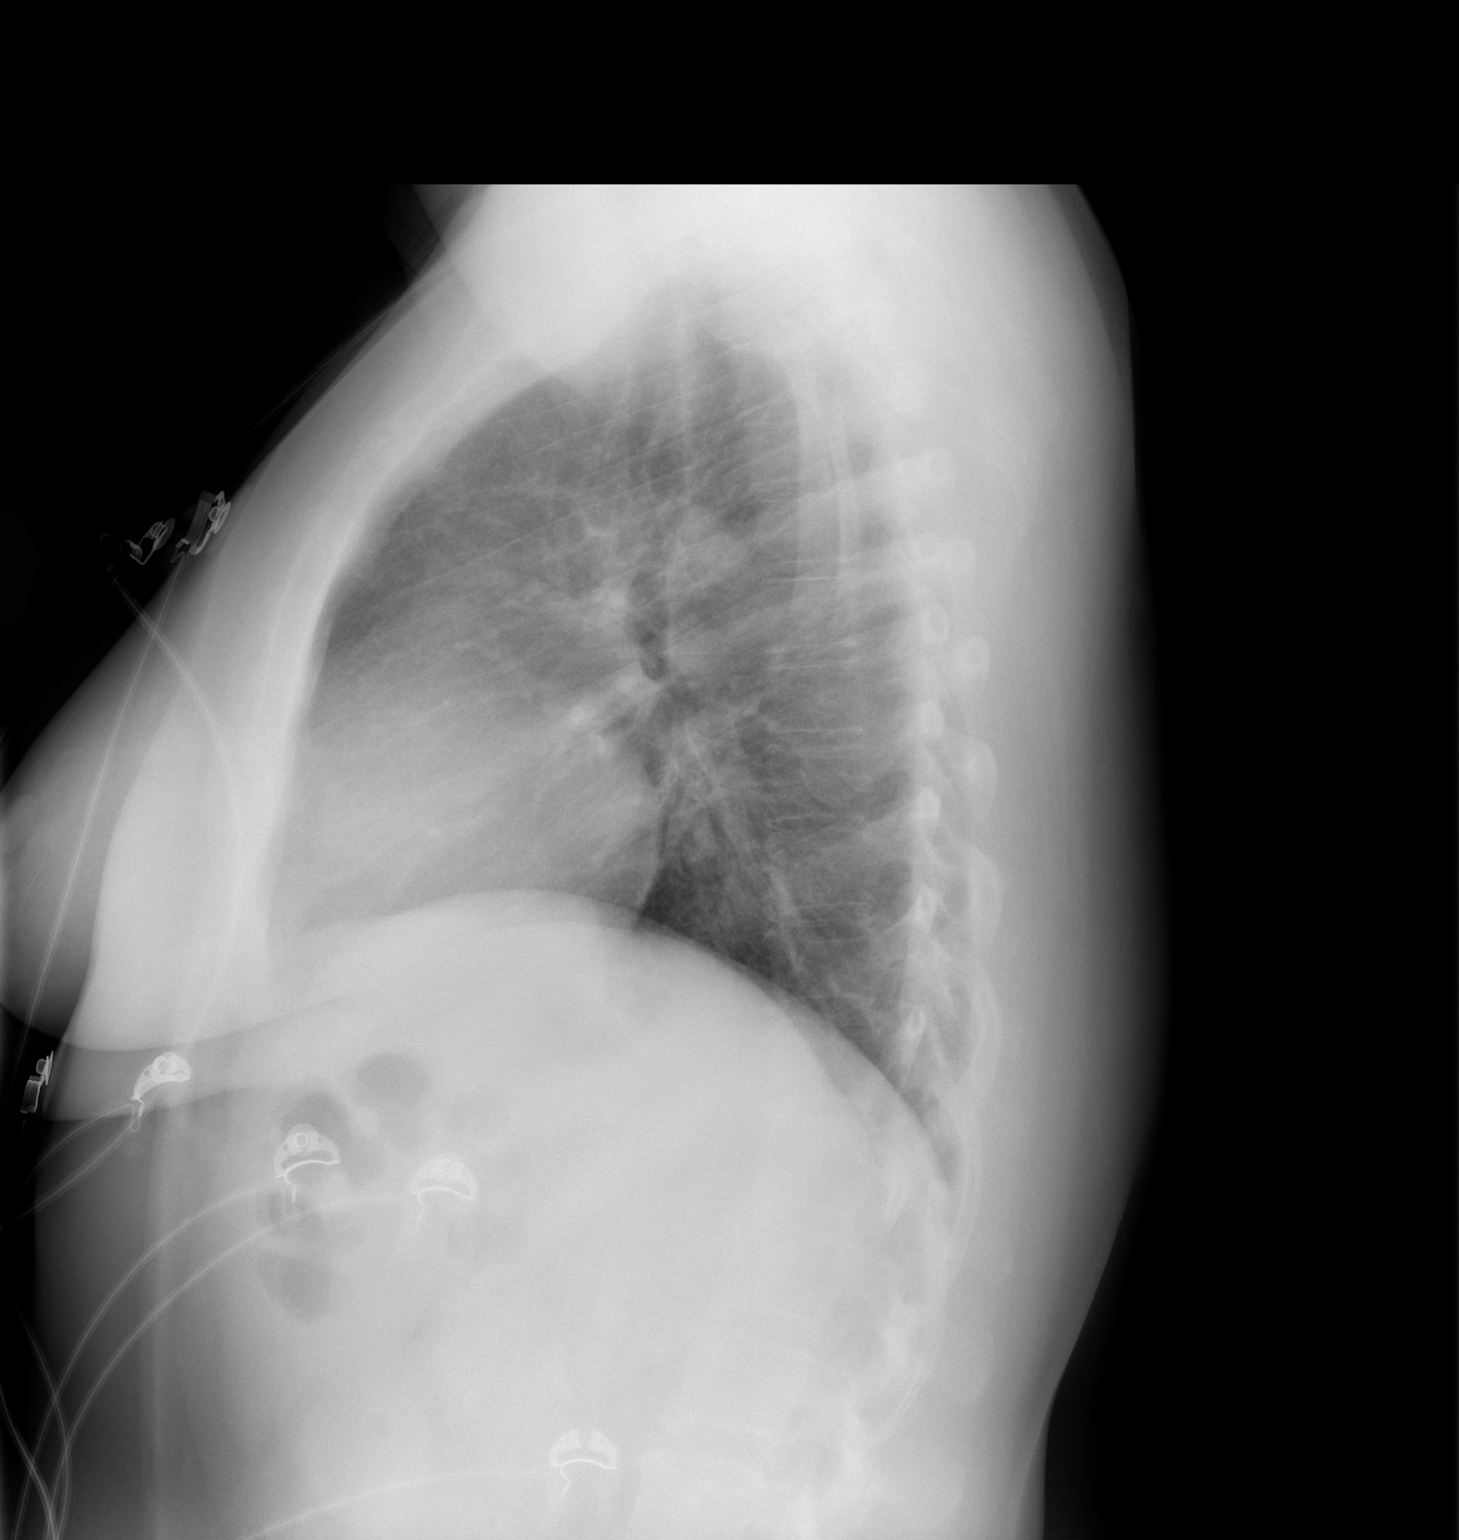

[2 of 2 positions shown; findings below may reference images not displayed]

FINDINGS: The heart size and mediastinal contours are within normal limits.
Both lungs are clear. The visualized skeletal structures are
unremarkable.
IMPRESSION: No active cardiopulmonary disease.

## 2021-12-18 ENCOUNTER — Ambulatory Visit: Payer: Managed Care, Other (non HMO) | Admitting: Nurse Practitioner
